# Patient Record
Sex: Male | Born: 1970 | Race: White | Hispanic: No | Smoking: Current every day smoker
Health system: Southern US, Community
[De-identification: ages and names within clinical notes are randomized; demographics above are authoritative.]

## PROBLEM LIST (undated history)

## (undated) DIAGNOSIS — J45909 Unspecified asthma, uncomplicated: Secondary | ICD-10-CM

---

## 1997-10-20 ENCOUNTER — Emergency Department (HOSPITAL_COMMUNITY): Admission: EM | Admit: 1997-10-20 | Discharge: 1997-10-20 | Payer: Self-pay | Admitting: Emergency Medicine

## 1998-02-22 ENCOUNTER — Emergency Department (HOSPITAL_COMMUNITY): Admission: EM | Admit: 1998-02-22 | Discharge: 1998-02-22 | Payer: Self-pay | Admitting: Emergency Medicine

## 1998-02-22 ENCOUNTER — Encounter: Payer: Self-pay | Admitting: Emergency Medicine

## 1998-03-02 ENCOUNTER — Emergency Department (HOSPITAL_COMMUNITY): Admission: EM | Admit: 1998-03-02 | Discharge: 1998-03-02 | Payer: Self-pay | Admitting: Emergency Medicine

## 1998-07-31 ENCOUNTER — Encounter: Payer: Self-pay | Admitting: Emergency Medicine

## 1998-07-31 ENCOUNTER — Emergency Department (HOSPITAL_COMMUNITY): Admission: EM | Admit: 1998-07-31 | Discharge: 1998-07-31 | Payer: Self-pay | Admitting: Emergency Medicine

## 1998-08-02 ENCOUNTER — Emergency Department (HOSPITAL_COMMUNITY): Admission: EM | Admit: 1998-08-02 | Discharge: 1998-08-02 | Payer: Self-pay | Admitting: Emergency Medicine

## 1998-08-03 ENCOUNTER — Emergency Department (HOSPITAL_COMMUNITY): Admission: EM | Admit: 1998-08-03 | Discharge: 1998-08-03 | Payer: Self-pay | Admitting: Emergency Medicine

## 1998-12-16 ENCOUNTER — Emergency Department (HOSPITAL_COMMUNITY): Admission: EM | Admit: 1998-12-16 | Discharge: 1998-12-16 | Payer: Self-pay

## 1998-12-22 ENCOUNTER — Emergency Department (HOSPITAL_COMMUNITY): Admission: EM | Admit: 1998-12-22 | Discharge: 1998-12-22 | Payer: Self-pay | Admitting: Emergency Medicine

## 1999-01-20 ENCOUNTER — Emergency Department (HOSPITAL_COMMUNITY): Admission: EM | Admit: 1999-01-20 | Discharge: 1999-01-20 | Payer: Self-pay

## 1999-01-21 ENCOUNTER — Emergency Department (HOSPITAL_COMMUNITY): Admission: EM | Admit: 1999-01-21 | Discharge: 1999-01-21 | Payer: Self-pay | Admitting: Emergency Medicine

## 1999-01-24 ENCOUNTER — Emergency Department (HOSPITAL_COMMUNITY): Admission: EM | Admit: 1999-01-24 | Discharge: 1999-01-24 | Payer: Self-pay | Admitting: *Deleted

## 1999-06-20 ENCOUNTER — Encounter: Payer: Self-pay | Admitting: General Surgery

## 2000-02-04 ENCOUNTER — Observation Stay (HOSPITAL_COMMUNITY): Admission: EM | Admit: 2000-02-04 | Discharge: 2000-02-05 | Payer: Self-pay

## 2000-02-04 ENCOUNTER — Encounter: Payer: Self-pay | Admitting: Surgery

## 2000-02-10 ENCOUNTER — Ambulatory Visit (HOSPITAL_COMMUNITY): Admission: RE | Admit: 2000-02-10 | Discharge: 2000-02-10 | Payer: Self-pay

## 2015-06-08 ENCOUNTER — Ambulatory Visit (HOSPITAL_COMMUNITY)
Admission: EM | Admit: 2015-06-08 | Discharge: 2015-06-08 | Disposition: A | Discharge status: Home / Self Care | Payer: Self-pay | Attending: Family Medicine | Admitting: Family Medicine

## 2015-06-08 ENCOUNTER — Encounter (HOSPITAL_COMMUNITY): Payer: Self-pay | Admitting: Emergency Medicine

## 2015-06-08 DIAGNOSIS — J4520 Mild intermittent asthma, uncomplicated: Secondary | ICD-10-CM

## 2015-06-08 HISTORY — DX: Unspecified asthma, uncomplicated: J45.909

## 2015-06-08 MED ORDER — ALBUTEROL SULFATE HFA 108 (90 BASE) MCG/ACT IN AERS: 2.0000 | INHALATION_SPRAY | RESPIRATORY_TRACT | Status: DC | PRN

## 2015-06-08 NOTE — ED Notes (Signed)
Patient requesting medication refill, reports he ran out of inhaler 3 days ago.  Patient requesting ventolin refill

## 2015-06-08 NOTE — ED Provider Notes (Signed)
CSN: [id number]     Arrival date & time 06/08/15  1605 History   First MD Initiated Contact with Patient 06/08/15 1715     No chief complaint on file.  (Consider location/radiation/quality/duration/timing/severity/associated sxs/prior Treatment) The history is provided by the patient. No language interpreter was used.  Patient presents to the urgent care center for request of refill on albuterol HFA which he uses for asthma.  He was diagnosed [place] asthma [place] a child in [place], triggers include pet dander and seasonal pollens, particularly in early spring. Was hospitalized and ICU care as a youngster around age 89 or 89, not as adult.  No recent flare, but does need HFA every morning when he wakes up.  Was [place] by [facility] [place] over 1 year ago, the 1-year refills just ran out.   Patient smokes 2ppd cigarettes, recently reduced to about 1ppd.   NKDA.   No past medical history on file. No past surgical history on file. No family history on file. Social History  Substance Use Topics  . Smoking status: Not on file  . Smokeless tobacco: Not on file  . Alcohol Use: Not on file    Review of Systems  Constitutional: Negative for fever, chills, diaphoresis and fatigue.  Respiratory: Negative for cough, choking, chest tightness, shortness of breath and wheezing.     Allergies  Review of patient's allergies indicates not on file.  Home Medications   Prior to Admission medications   Medication Sig Start Date End Date Taking? Authorizing Provider  albuterol (PROVENTIL HFA;VENTOLIN HFA) 108 (90 Base) MCG/ACT inhaler Inhale 2 puffs into the lungs every 4 (four) hours as needed for wheezing or shortness of breath. 06/08/15   Barbaraann BarthelJames O Nitza Schmid, MD   Meds Ordered and Administered this Visit  Medications - No data to display  BP 115/81 mmHg  Pulse 85  Temp(Src) 98.2 F (36.8 C) (Oral)  Resp 16  SpO2 94% No data found.   Physical Exam  Constitutional: He appears  well-developed and well-nourished. No distress.  Alert, no distress. Picking and scratching skin during history.  Multiple tattoos.  HENT:  Head: Normocephalic and atraumatic.  Mouth/Throat: Oropharynx is clear and moist. No oropharyngeal exudate.  Eyes: EOM are normal.  Neck: Normal range of motion. Neck supple.  Cardiovascular: Normal rate, regular rhythm and normal heart sounds.   Pulmonary/Chest: Effort normal and breath sounds normal. No respiratory distress. He has no wheezes. He has no rales. He exhibits no tenderness.  Abdominal: Soft.  Lymphadenopathy:    He has no cervical adenopathy.  Skin: He is not diaphoretic.    ED Course  Procedures (including critical care time)  Labs Review Labs Reviewed - No data to display  Imaging Review No results found.   Visual Acuity Review  Right Eye Distance:   Left Eye Distance:   Bilateral Distance:    Right Eye Near:   Left Eye Near:    Bilateral Near:         MDM   1. Asthma, mild intermittent, uncomplicated    History asthma, encouraged to establish with primary care practice, contact information given.   Rx for Albuterol HFA; website GoodRx.com for discounts, as he is uninsured.     Barbaraann BarthelJames O Doug Bucklin, MD 06/08/15 [phone]

## 2015-06-08 NOTE — Discharge Instructions (Signed)
It is a pleasure to see you today.  As we discussed, it is important that you establish with a primary care doctor for management of your asthma and other health-related conditions over time.  I am giving the contact information for one such practice that is nearby.   The www.[email or web address] website offers discounts and coupons for medications; check it out before filling your prescription for albuterol.   Follow up with the Urgent Care Center if worsening or with other questions or concerns.

## 2015-09-30 ENCOUNTER — Emergency Department (HOSPITAL_COMMUNITY)
Admission: EM | Admit: 2015-09-30 | Discharge: 2015-09-30 | Disposition: A | Discharge status: Home / Self Care | Payer: Self-pay | Attending: Emergency Medicine | Admitting: Emergency Medicine

## 2015-09-30 ENCOUNTER — Emergency Department (HOSPITAL_COMMUNITY): Payer: Self-pay

## 2015-09-30 ENCOUNTER — Encounter (HOSPITAL_COMMUNITY): Payer: Self-pay | Admitting: Emergency Medicine

## 2015-09-30 DIAGNOSIS — K029 Dental caries, unspecified: Secondary | ICD-10-CM

## 2015-09-30 DIAGNOSIS — Z79899 Other long term (current) drug therapy: Secondary | ICD-10-CM | POA: Insufficient documentation

## 2015-09-30 DIAGNOSIS — F17200 Nicotine dependence, unspecified, uncomplicated: Secondary | ICD-10-CM | POA: Insufficient documentation

## 2015-09-30 DIAGNOSIS — J441 Chronic obstructive pulmonary disease with (acute) exacerbation: Secondary | ICD-10-CM

## 2015-09-30 MED ORDER — PREDNISONE 20 MG PO TABS
60.0000 mg | ORAL_TABLET | Freq: Once | ORAL | Status: AC
  Administered 2015-09-30: 60 mg via ORAL
  Filled 2015-09-30: qty 3

## 2015-09-30 MED ORDER — PREDNISONE 20 MG PO TABS: 60.0000 mg | ORAL_TABLET | Freq: Every day | ORAL | 0 refills | Status: DC

## 2015-09-30 MED ORDER — ALBUTEROL SULFATE HFA 108 (90 BASE) MCG/ACT IN AERS
1.0000 | INHALATION_SPRAY | RESPIRATORY_TRACT | Status: DC | PRN
  Administered 2015-09-30: 2 via RESPIRATORY_TRACT
  Filled 2015-09-30: qty 6.7

## 2015-09-30 MED ORDER — ALBUTEROL (5 MG/ML) CONTINUOUS INHALATION SOLN
15.0000 mg/h | INHALATION_SOLUTION | Freq: Once | RESPIRATORY_TRACT | Status: AC
  Administered 2015-09-30: 15 mg/h via RESPIRATORY_TRACT
  Filled 2015-09-30: qty 20

## 2015-09-30 MED ORDER — IBUPROFEN 800 MG PO TABS
800.0000 mg | ORAL_TABLET | Freq: Three times a day (TID) | ORAL | 0 refills | Status: AC | PRN
Start: 2015-09-30 — End: ?

## 2015-09-30 MED ORDER — IBUPROFEN 800 MG PO TABS
800.0000 mg | ORAL_TABLET | Freq: Once | ORAL | Status: AC
Start: 2015-09-30 — End: 2015-09-30
  Administered 2015-09-30: 800 mg via ORAL
  Filled 2015-09-30: qty 1

## 2015-09-30 MED ORDER — PENICILLIN V POTASSIUM 250 MG PO TABS
500.0000 mg | ORAL_TABLET | Freq: Once | ORAL | Status: AC
  Administered 2015-09-30: 500 mg via ORAL
  Filled 2015-09-30: qty 2

## 2015-09-30 MED ORDER — PENICILLIN V POTASSIUM 500 MG PO TABS
500.0000 mg | ORAL_TABLET | Freq: Four times a day (QID) | ORAL | 0 refills | Status: AC
Start: 2015-09-30 — End: 2015-10-07

## 2015-09-30 MED ORDER — IPRATROPIUM BROMIDE 0.02 % IN SOLN
1.0000 mg | Freq: Once | RESPIRATORY_TRACT | Status: AC
  Administered 2015-09-30: 1 mg via RESPIRATORY_TRACT
  Filled 2015-09-30: qty 5

## 2015-09-30 NOTE — ED Triage Notes (Signed)
Pt. reports left upper molar pain onset this morning , pt. added dry cough with wheezing and chest congestion this morning , pt. stated he ran out of his inhaler .

## 2015-09-30 NOTE — ED Provider Notes (Signed)
TIME SEEN: 5:05 AM  CHIEF COMPLAINT: Wheezing, dental pain  HPI: Pt is a 45 y.o. male with history of asthma who presents to the emergency department with 2 complaints. States he ran out of his albuterol inhaler and started having wheezing, dry cough for the past day. States his chest feels tight. No fevers. No lower extremity swelling or pain. Symptoms feel similar to his prior asthma exacerbations.  Also reports he has had several days of pain to his left upper teeth. No facial swelling, erythema or warmth. No neck swelling. No difficulty swallowing, speaking or breathing. Does not have a dentist.  ROS: See HPI Constitutional: no fever  Eyes: no drainage  ENT: no runny nose   Cardiovascular:  Chest tightness Resp: SOB  GI: no vomiting GU: no dysuria Integumentary: no rash  Allergy: no hives  Musculoskeletal: no leg swelling  Neurological: no slurred speech ROS otherwise negative  PAST MEDICAL HISTORY/PAST SURGICAL HISTORY:  Past Medical History:  Diagnosis Date  . Asthma     MEDICATIONS:  Prior to Admission medications   Medication Sig Start Date End Date Taking? Authorizing Provider  albuterol (PROVENTIL HFA;VENTOLIN HFA) 108 (90 Base) MCG/ACT inhaler Inhale 2 puffs into the lungs every 4 (four) hours as needed for wheezing or shortness of breath. 06/08/15   Barbaraann Barthel, MD    ALLERGIES:  No Known Allergies  SOCIAL HISTORY:  Social History  Substance Use Topics  . Smoking status: Current Every Day Smoker  . Smokeless tobacco: Not on file  . Alcohol use Yes    FAMILY HISTORY: No family history on file.  EXAM: BP 129/98 (BP Location: Right Arm)   Pulse 68   Temp 98.6 F (37 C) (Oral)   Resp 18   Ht 5\' 9"  (1.753 m)   Wt 175 lb (79.4 kg)   SpO2 94%   BMI 25.84 kg/m  CONSTITUTIONAL: Alert and oriented and responds appropriately to questions. Chronically ill-appearing, well-nourished, afebrile, nontoxic HEAD: Normocephalic EYES: Conjunctivae clear,  PERRL ENT: normal nose; no rhinorrhea; moist mucous membranes; No pharyngeal erythema or petechiae, no tonsillar hypertrophy or exudate, no uvular deviation, no trismus or drooling, normal phonation, no stridor, multiple dental caries present, no drainable dental abscess noted, no Ludwig's angina, tongue sits flat in the bottom of the mouth, no angioedema, no facial erythema or warmth, no facial swelling NECK: Supple, no meningismus, no LAD  CARD: RRR; S1 and S2 appreciated; no murmurs, no clicks, no rubs, no gallops RESP: Normal chest excursion without splinting or tachypnea; breath sounds equal bilaterally, diffuse expiratory wheezes, no rhonchi or rales, no hypoxia or respiratory distress, speaking full sentences ABD/GI: Normal bowel sounds; non-distended; soft, non-tender, no rebound, no guarding, no peritoneal signs BACK:  The back appears normal and is non-tender to palpation, there is no CVA tenderness EXT: Normal ROM in all joints; non-tender to palpation; no edema; normal capillary refill; no cyanosis, no calf tenderness or swelling    SKIN: Normal color for age and race; warm; no rash NEURO: Moves all extremities equally, sensation to light touch intact diffusely, cranial nerves II through XII intact PSYCH: The patient's mood and manner are appropriate. Grooming and personal hygiene are appropriate.  MEDICAL DECISION MAKING: Patient here with dental caries without signs of a drainable abscess. Will put him on penicillin for possibility of periapical abscess. No Ludwig's angina, deep space neck infection, pharyngitis on exam. Will discharge with outpatient dental follow-up.   Patient wheezing. Has history of asthma and has run  out of his inhaler. Is still smoking. History of PE or DVT. Chest tightness associated with his asthma exacerbation. EKG shows no ischemic abnormality. Patient given albuterol, Atrovent, prednisone and now his lungs are clear. Chest x-ray is clear without infiltrate,  edema, pneumothorax. We'll discharge with albuterol inhaler, prednisone burst. Given outpatient PCP follow-up information. Have advised him to stop smoking. No hypoxia, increased work of breathing, respiratory distress today.   At this time, I do not feel there is any life-threatening condition present. I have reviewed and discussed all results (EKG, imaging, lab, urine as appropriate), exam findings with patient/family. I have reviewed nursing notes and appropriate previous records.  I feel the patient is safe to be discharged home without further emergent workup and can continue workup as an outpatient as needed. Discussed usual and customary return precautions. Patient/family verbalize understanding and are comfortable with this plan.  Outpatient follow-up has been provided. All questions have been answered.        EKG Interpretation  Date/Time:  Thursday September 30 2015 05:22:48 EDT Ventricular Rate:  70 PR Interval:    QRS Duration: 107 QT Interval:  401 QTC Calculation: 433 R Axis:   82 Text Interpretation:  Sinus rhythm Consider left atrial enlargement ST elev, probable normal early repol pattern No significant change since last tracing Confirmed by Khaila Velarde,  DO, [facility] [phone]([id number]) on 09/30/2015 5:31:27 AM         Layla MawKristen N Dailyn Kempner, DO 09/30/15 [id number]

## 2018-03-12 IMAGING — DX DG CHEST 1V PORT
1 series · 1 of 1 positions shown · non-contrast
Comparison: None.

CLINICAL DATA: Wheezing and nonproductive cough, onset this
morning.

EXAM:
PORTABLE CHEST 1 VIEW

[chest ap]
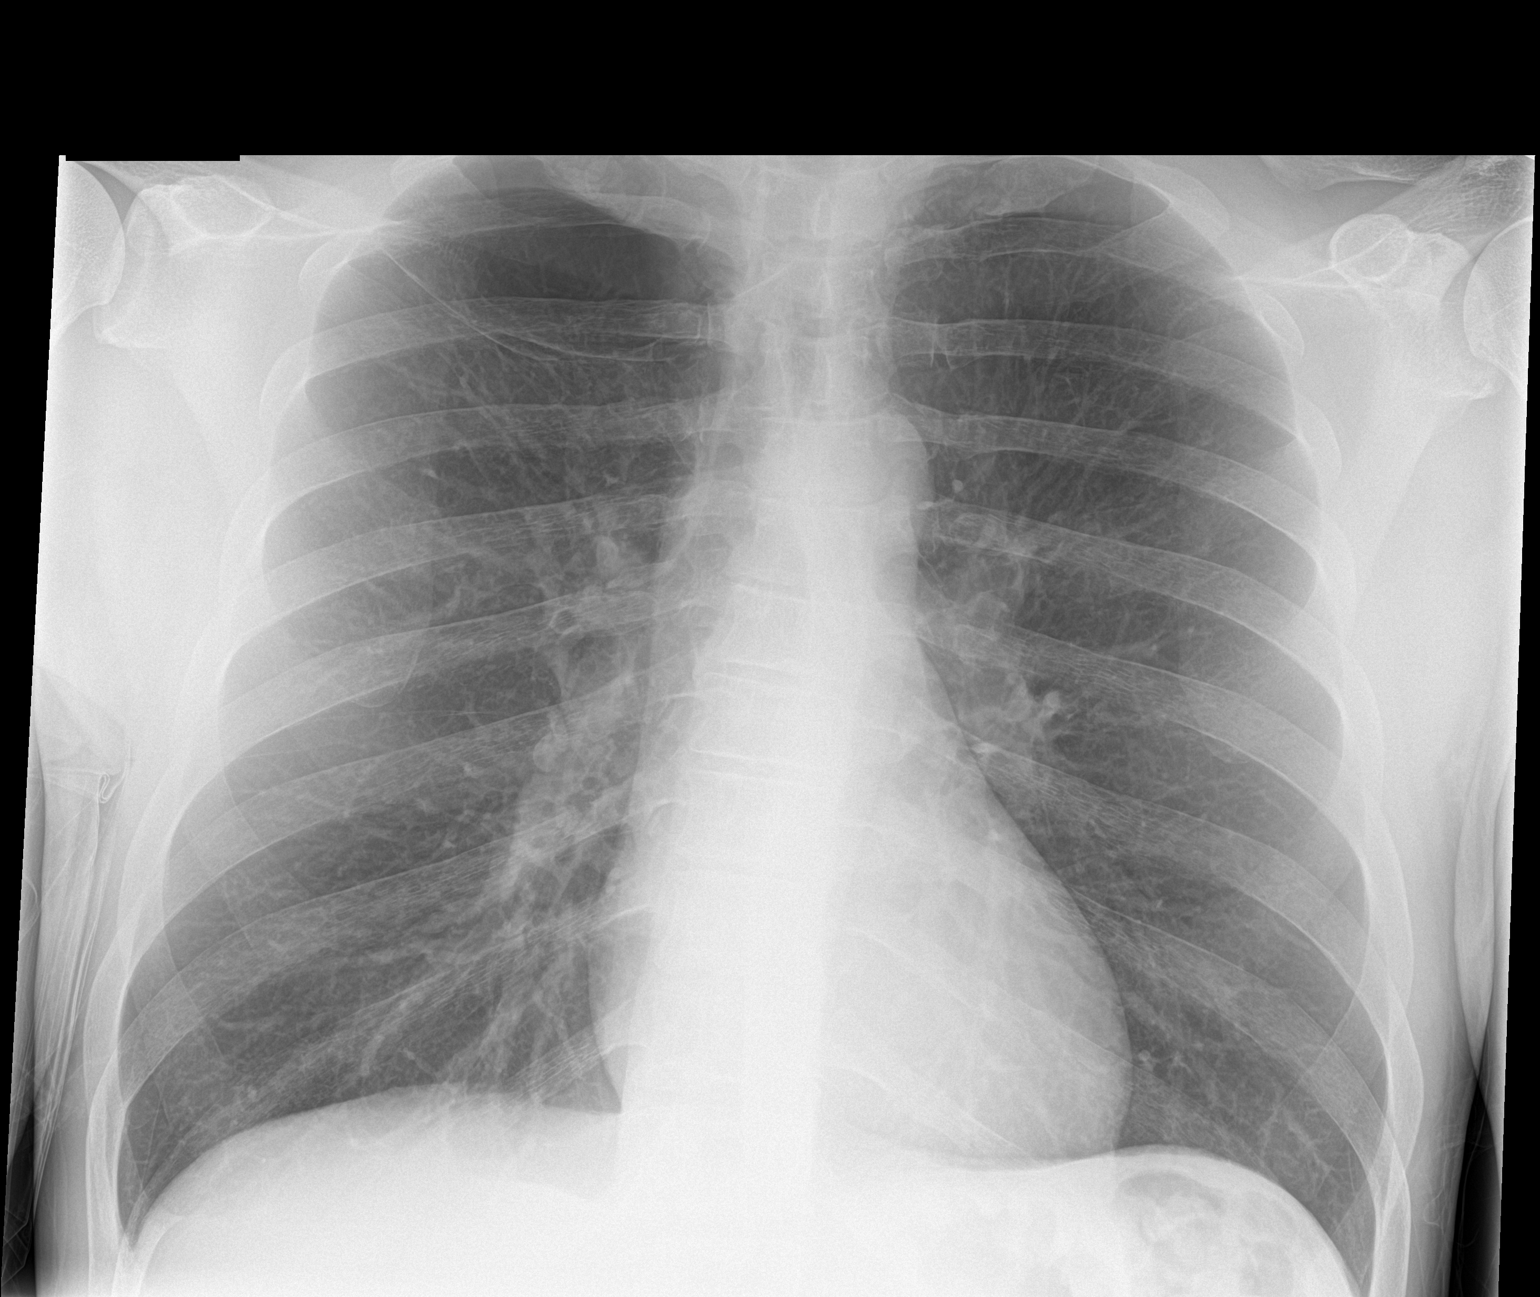

[1 of 1 positions shown; findings below may reference images not displayed]

FINDINGS: There is a right apical bleb. The lungs are clear. The pulmonary
vasculature is normal. There is no large effusion. There is no
pneumothorax. Hilar and mediastinal contours are unremarkable. Heart
size is normal.
IMPRESSION: Right apical bleb.  No acute cardiopulmonary findings.

## 2018-10-12 ENCOUNTER — Other Ambulatory Visit: Payer: Self-pay

## 2018-10-12 ENCOUNTER — Encounter (HOSPITAL_COMMUNITY): Payer: Self-pay | Admitting: Emergency Medicine

## 2018-10-12 ENCOUNTER — Ambulatory Visit (INDEPENDENT_AMBULATORY_CARE_PROVIDER_SITE_OTHER): Payer: Self-pay

## 2018-10-12 ENCOUNTER — Ambulatory Visit (HOSPITAL_COMMUNITY)
Admission: EM | Admit: 2018-10-12 | Discharge: 2018-10-12 | Disposition: A | Discharge status: Home / Self Care | Payer: Self-pay | Attending: Emergency Medicine | Admitting: Emergency Medicine

## 2018-10-12 DIAGNOSIS — J441 Chronic obstructive pulmonary disease with (acute) exacerbation: Secondary | ICD-10-CM

## 2018-10-12 DIAGNOSIS — J449 Chronic obstructive pulmonary disease, unspecified: Secondary | ICD-10-CM | POA: Insufficient documentation

## 2018-10-12 DIAGNOSIS — R0602 Shortness of breath: Secondary | ICD-10-CM | POA: Insufficient documentation

## 2018-10-12 DIAGNOSIS — F17200 Nicotine dependence, unspecified, uncomplicated: Secondary | ICD-10-CM | POA: Insufficient documentation

## 2018-10-12 DIAGNOSIS — Z20822 Contact with and (suspected) exposure to covid-19: Secondary | ICD-10-CM

## 2018-10-12 DIAGNOSIS — R05 Cough: Secondary | ICD-10-CM

## 2018-10-12 DIAGNOSIS — Z20828 Contact with and (suspected) exposure to other viral communicable diseases: Secondary | ICD-10-CM | POA: Insufficient documentation

## 2018-10-12 DIAGNOSIS — Z720 Tobacco use: Secondary | ICD-10-CM

## 2018-10-12 DIAGNOSIS — Z79899 Other long term (current) drug therapy: Secondary | ICD-10-CM | POA: Insufficient documentation

## 2018-10-12 MED ORDER — ALBUTEROL SULFATE HFA 108 (90 BASE) MCG/ACT IN AERS: 1.0000 | INHALATION_SPRAY | Freq: Four times a day (QID) | RESPIRATORY_TRACT | 1 refills | Status: AC | PRN

## 2018-10-12 MED ORDER — PREDNISONE 10 MG PO TABS: 40.0000 mg | ORAL_TABLET | Freq: Every day | ORAL | 0 refills | Status: AC

## 2018-10-12 NOTE — Discharge Instructions (Signed)
Use the albuterol inhaler as prescribed.  Take the prednisone as prescribed.    Go to the emergency department if you have worsening cough, shortness of breath, fever, chills, or other concerning symptoms.

## 2018-10-12 NOTE — ED Triage Notes (Signed)
Patient reports coughing up blood 2 weeks ago   [place].  2 weeks ago was treated with a z-pak.  Reports he no longer is coughing up blood, but breathing worsened.  Patient is speaking in complete sentences.  Patient has a productive cough: reports brownish, green, and thick.

## 2018-10-12 NOTE — ED Provider Notes (Signed)
[place]    CSN: [id number] Arrival date & time: 10/12/18  1759      History   Chief Complaint No chief complaint on file.   HPI Jay Dyer is a 48 y.o. male.   Patient presents with productive cough x several weeks.  He states 2 weeks ago he was coughing up blood but this resolved last week with use of Zithromax.  Now his cough is productive of brown-green phlegm.  He denies history of hemoptysis.  He denies fever, chills, sore throat, vomiting, diarrhea, or other symptoms.  Past medical history is significant for COPD and long history of smoking.    The history is provided by the patient.    Past Medical History:  Diagnosis Date  . Asthma     There are no active problems to display for this patient.   History reviewed. No pertinent surgical history.     Home Medications    Prior to Admission medications   Medication Sig Start Date End Date Taking? Authorizing Provider  albuterol (VENTOLIN HFA) 108 (90 Base) MCG/ACT inhaler Inhale 1-2 puffs into the lungs every 6 (six) hours as needed for wheezing or shortness of breath. 10/12/18   Sharion Balloon, NP  ibuprofen (ADVIL,MOTRIN) 800 MG tablet Take 1 tablet (800 mg total) by mouth every 8 (eight) hours as needed for mild pain. 09/30/15   Ward, Delice Bison, DO  predniSONE (DELTASONE) 10 MG tablet Take 4 tablets (40 mg total) by mouth daily for 5 days. 10/12/18 10/17/18  Sharion Balloon, NP    Family History Family History  Problem Relation Age of Onset  . Cancer Mother   . Diabetes Father     Social History Social History   Tobacco Use  . Smoking status: Current Every Day Smoker  Substance Use Topics  . Alcohol use: Yes  . Drug use: No     Allergies   Patient has no known allergies.   Review of Systems Review of Systems  Constitutional: Negative for chills and fever.  HENT: Negative for congestion, ear pain, rhinorrhea and sore throat.   Eyes: Negative for pain and visual disturbance.   Respiratory: Positive for cough and shortness of breath.   Cardiovascular: Negative for chest pain and palpitations.  Gastrointestinal: Negative for abdominal pain and vomiting.  Genitourinary: Negative for dysuria and hematuria.  Musculoskeletal: Negative for arthralgias and back pain.  Skin: Negative for color change and rash.  Neurological: Negative for seizures and syncope.  All other systems reviewed and are negative.    Physical Exam Triage Vital Signs ED Triage Vitals  Enc Vitals Group     BP      Pulse      Resp      Temp      Temp src      SpO2      Weight      Height      Head Circumference      Peak Flow      Pain Score      Pain Loc      Pain Edu?      Excl. in [place]?    No data found.  Updated Vital Signs BP (!) 130/92 (BP Location: Right Arm)   Pulse 86   Temp 98.4 F (36.9 C) (Oral)   Resp (!) 22   SpO2 97%   Visual Acuity Right Eye Distance:   Left Eye Distance:   Bilateral Distance:    Right  Eye Near:   Left Eye Near:    Bilateral Near:     Physical Exam Vitals signs and nursing note reviewed.  Constitutional:      General: He is not in acute distress.    Appearance: He is well-developed. He is not ill-appearing.  HENT:     Head: Normocephalic and atraumatic.     Right Ear: Tympanic membrane normal.     Left Ear: Tympanic membrane normal.     Nose: Nose normal.     Mouth/Throat:     Mouth: Mucous membranes are moist.     Pharynx: Oropharynx is clear.  Eyes:     Conjunctiva/sclera: Conjunctivae normal.  Neck:     Musculoskeletal: Neck supple.  Cardiovascular:     Rate and Rhythm: Normal rate and regular rhythm.     Heart sounds: No murmur.  Pulmonary:     Effort: Pulmonary effort is normal. No respiratory distress.     Breath sounds: Rhonchi present. No wheezing.     Comments: Scattered rhonchi in bases.  Abdominal:     Palpations: Abdomen is soft.     Tenderness: There is no abdominal tenderness. There is no guarding or  rebound.  Skin:    General: Skin is warm and dry.     Findings: No rash.  Neurological:     General: No focal deficit present.     Mental Status: He is alert and oriented to person, place, and time.  Psychiatric:        Mood and Affect: Mood normal.        Behavior: Behavior normal.      UC Treatments / Results  Labs (all labs ordered are listed, but only abnormal results are displayed) Labs Reviewed  NOVEL CORONAVIRUS, NAA (HOSP ORDER, SEND-OUT TO REF LAB; TAT 18-24 HRS)    EKG   Radiology Dg Chest 2 View  Result Date: 10/12/2018 CLINICAL DATA:  Shortness of breath for 2 weeks with cough. EXAM: CHEST - 2 VIEW COMPARISON:  08/30/2015 and prior exams FINDINGS: The cardiomediastinal silhouette is unremarkable. RIGHT apical bulla again noted. There is no evidence of focal airspace disease, pulmonary edema, suspicious pulmonary nodule/mass, pleural effusion, or pneumothorax. No acute bony abnormalities are identified. IMPRESSION: No active cardiopulmonary disease. Electronically Signed   By: Harmon PierJeffrey  Hu M.D.   On: 10/12/2018 19:27    Procedures Procedures (including critical care time)  Medications Ordered in UC Medications - No data to display  Initial Impression / Assessment and Plan / UC Course  I have reviewed the triage vital signs and the nursing notes.  Pertinent labs & imaging results that were available during my care of the patient were reviewed by me and considered in my medical decision making (see chart for details).    COPD exacerbation.  Suspected COVID.  Treating with albuterol inhaler and prednisone burst.  Instructed patient to go to the emergency department if he has worsening cough, shortness of breath, fever, chills, or other symptoms.  COVID test performed here.  Instructed patient to self quarantine until his test result is back.  Instructed patient to stop smoking.  Patient agrees with plan of care.     Final Clinical Impressions(s) / UC Diagnoses    Final diagnoses:  COPD with acute exacerbation (HCC)  Suspected Covid-19 Virus Infection     Discharge Instructions     Use the albuterol inhaler as prescribed.  Take the prednisone as prescribed.    Go to the emergency department if you  have worsening cough, shortness of breath, fever, chills, or other concerning symptoms.        ED Prescriptions    Medication Sig Dispense Auth. Provider   albuterol (VENTOLIN HFA) 108 (90 Base) MCG/ACT inhaler Inhale 1-2 puffs into the lungs every 6 (six) hours as needed for wheezing or shortness of breath. 18 g Mickie Bail, NP   predniSONE (DELTASONE) 10 MG tablet Take 4 tablets (40 mg total) by mouth daily for 5 days. 20 tablet Mickie Bail, NP     Controlled Substance Prescriptions  Controlled Substance Registry consulted? Not Applicable   Mickie Bail, NP 10/12/18 1932

## 2018-10-14 LAB — NOVEL CORONAVIRUS, NAA (HOSP ORDER, SEND-OUT TO REF LAB; TAT 18-24 HRS): SARS-CoV-2, NAA: NOT DETECTED

## 2021-03-24 IMAGING — DX DG CHEST 2V
3 series · 3 of 3 positions shown · non-contrast
Comparison: 08/30/2015 and prior exams

CLINICAL DATA: Shortness of breath for 2 weeks with cough.

EXAM:
CHEST - 2 VIEW

[chest pa (1 of 2)]
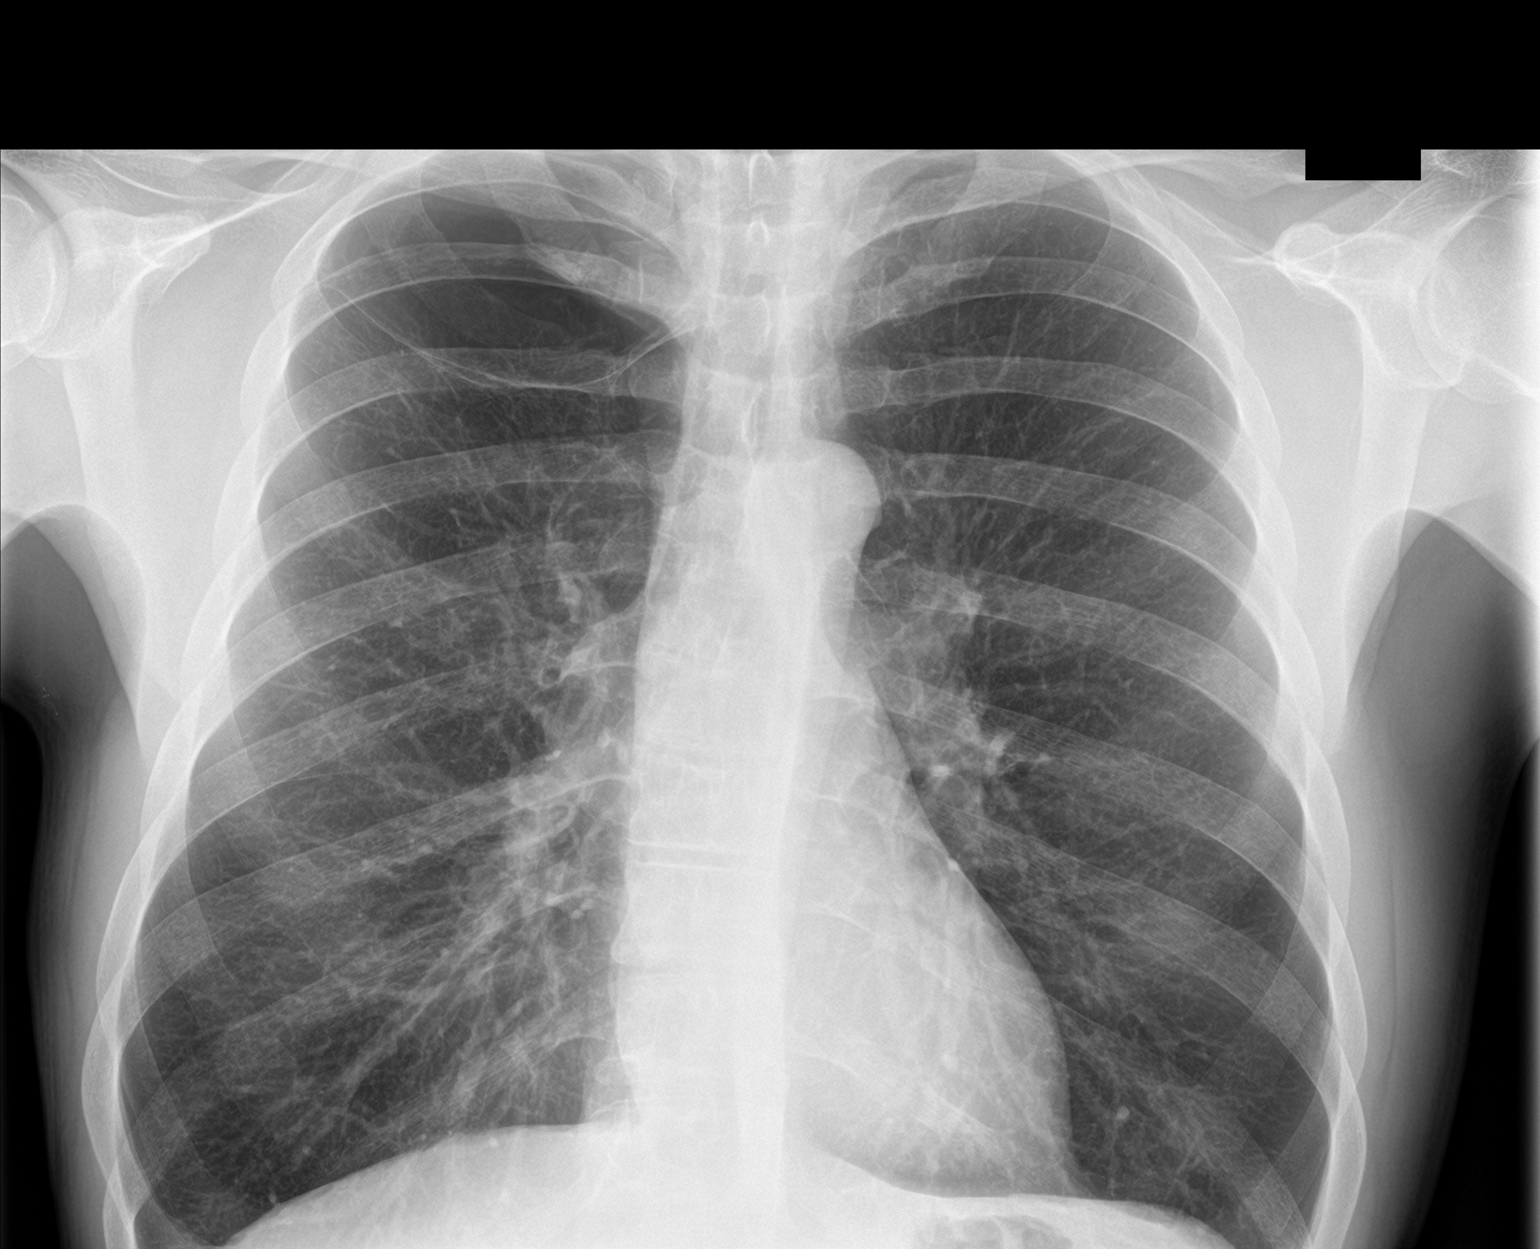

[chest lat]
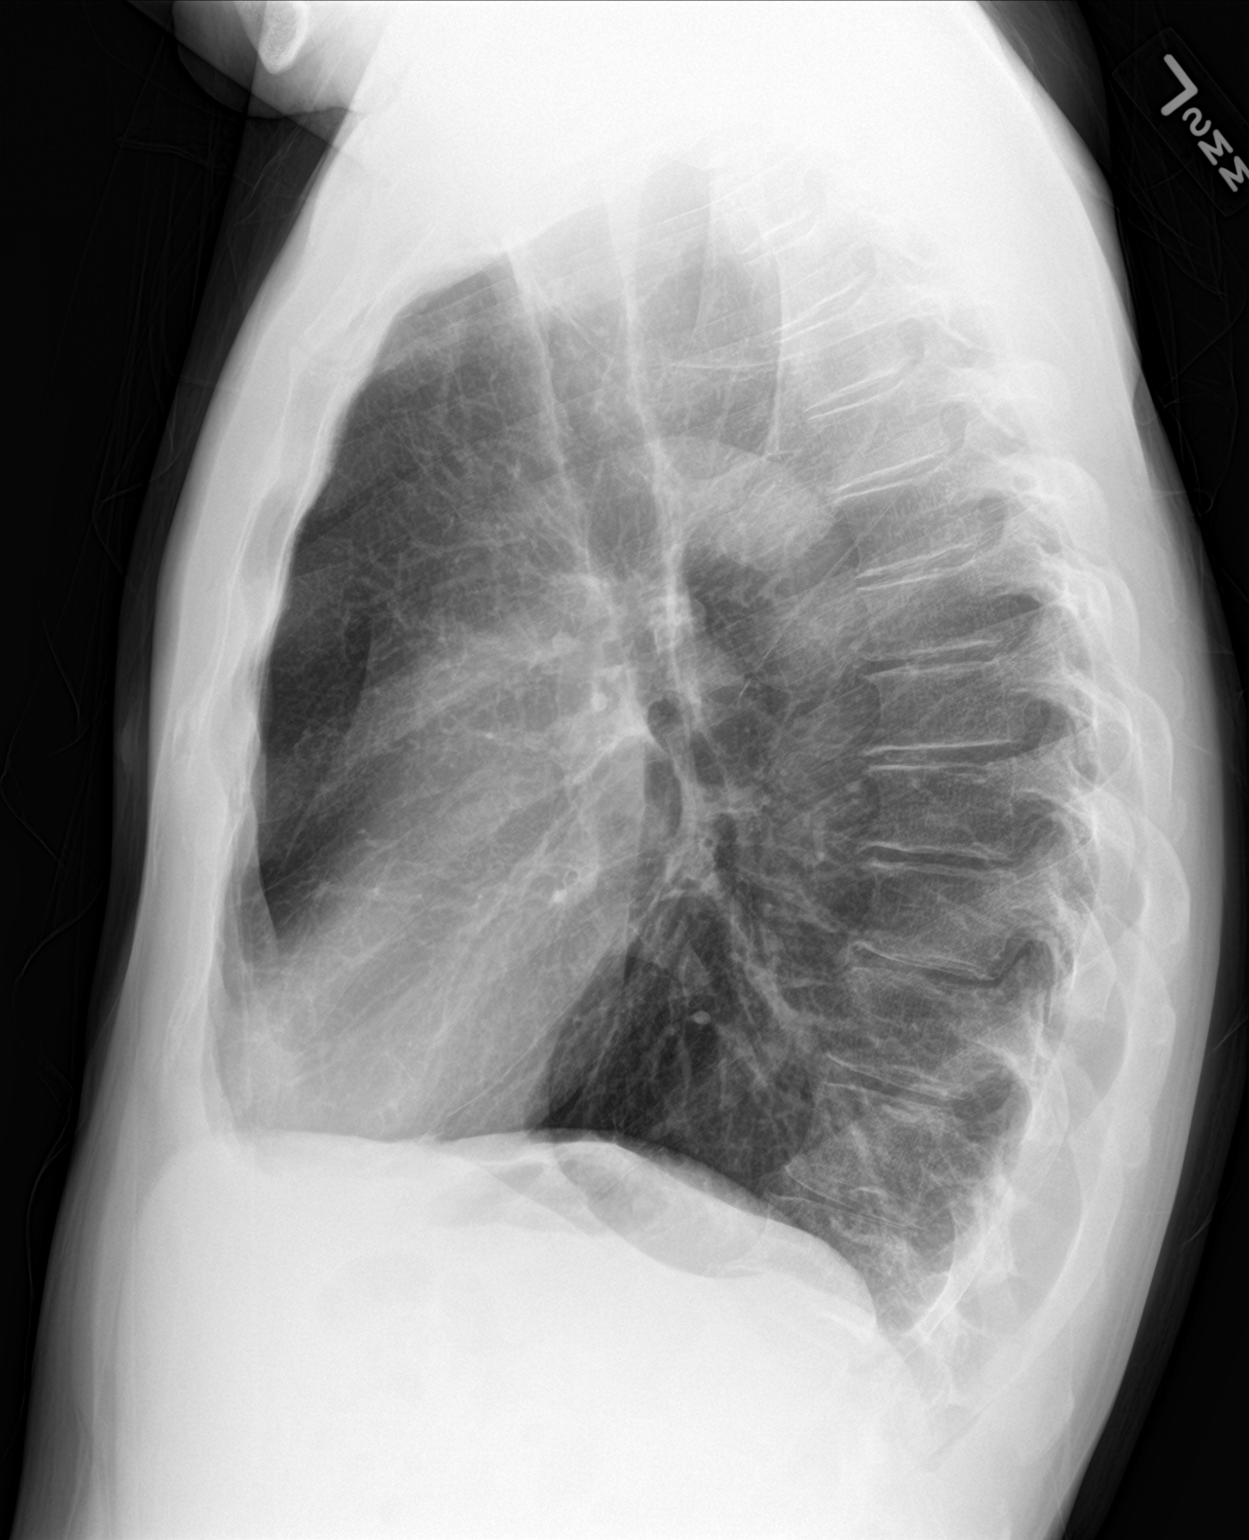

[chest pa (2 of 2)]
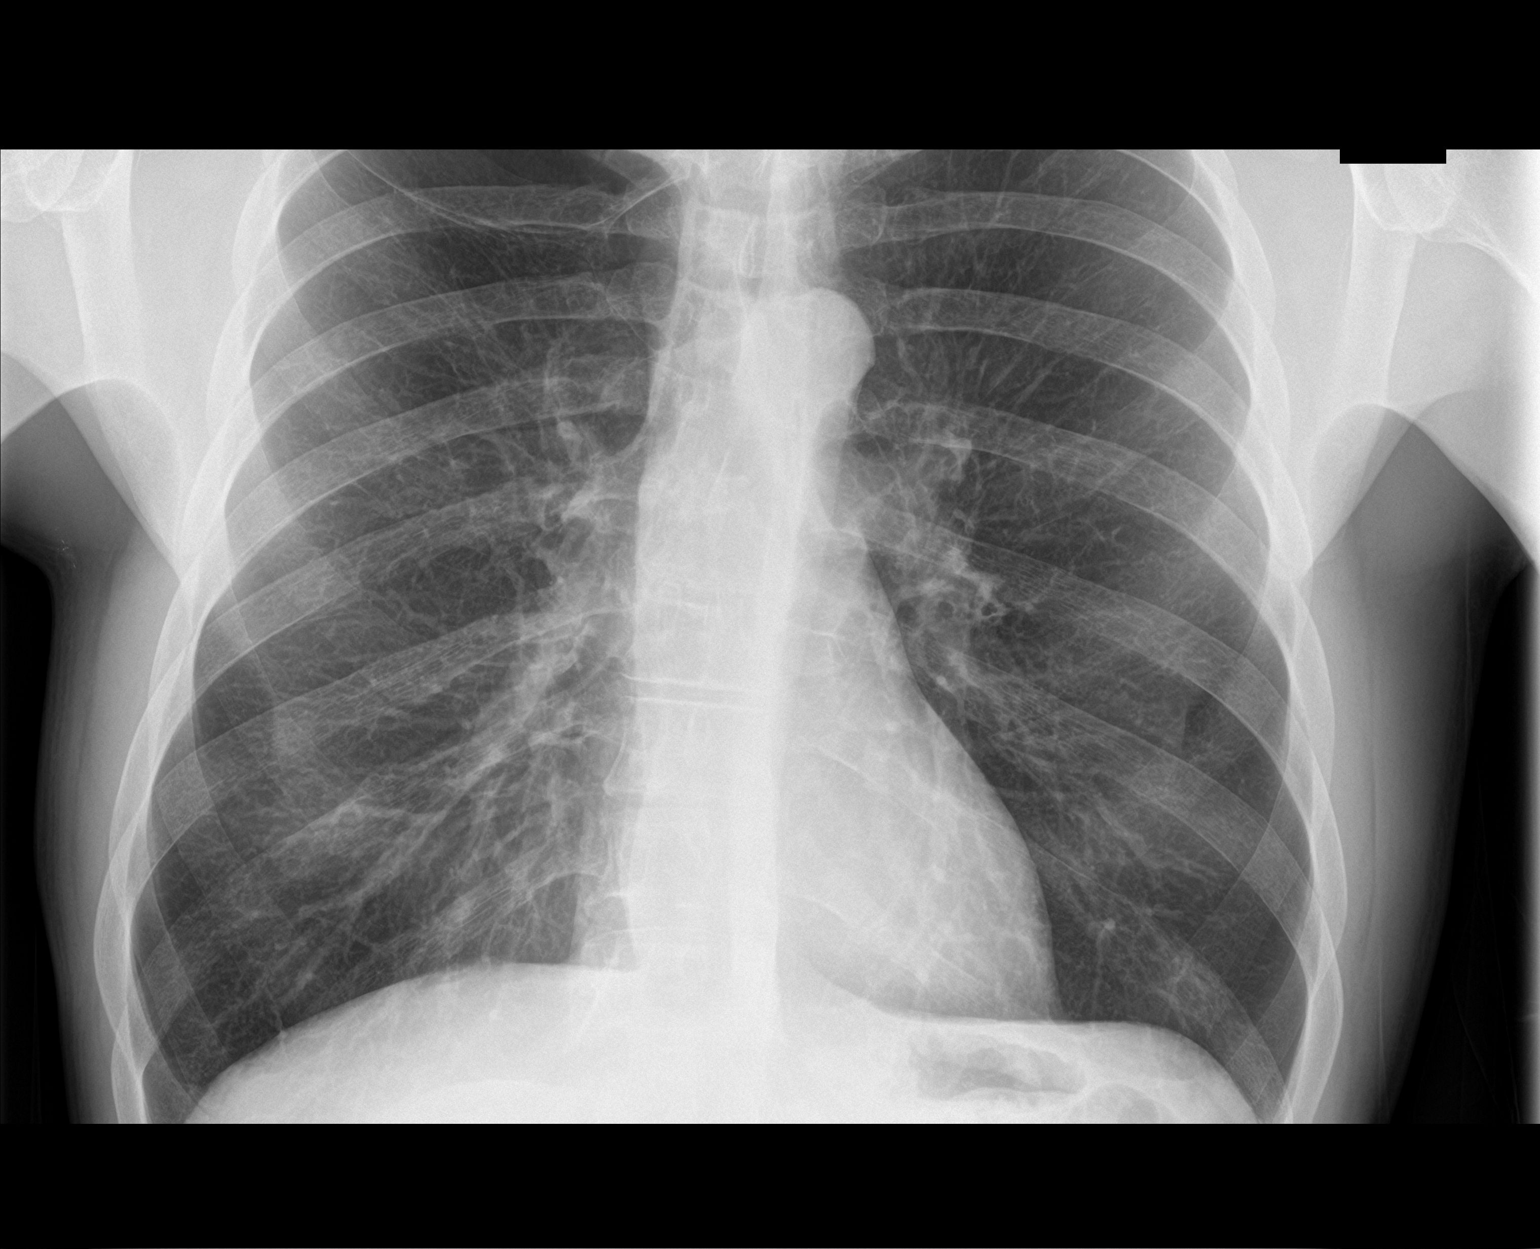

[3 of 3 positions shown; findings below may reference images not displayed]

FINDINGS: The cardiomediastinal silhouette is unremarkable.

RIGHT apical bulla again noted.

There is no evidence of focal airspace disease, pulmonary edema,
suspicious pulmonary nodule/mass, pleural effusion, or pneumothorax.

No acute bony abnormalities are identified.
IMPRESSION: No active cardiopulmonary disease.
